# Patient Record
Sex: Male | Born: 1995
Health system: Southern US, Community
[De-identification: ages and names within clinical notes are randomized; demographics above are authoritative.]

---
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
## 2016-01-15 IMAGING — US US SCROTUM
1 series · 14 of 25 positions shown · non-contrast
Comparison: 07/13/2011

CLINICAL DATA: Left varices

EXAM:
ULTRASOUND OF SCROTUM
TECHNIQUE: Complete ultrasound examination of the testicles, epididymis, and
other scrotal structures was performed.

[Series 1: us scrotum · 0.05mm/px · 14 of 58 slices shown]
[im 1/58]
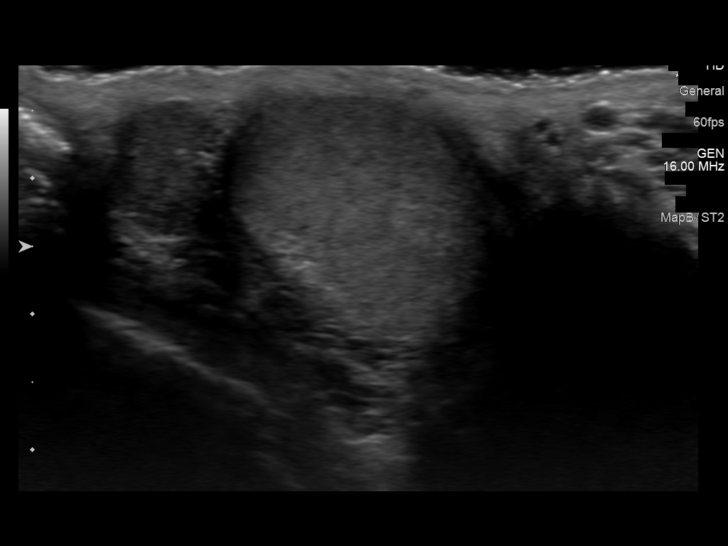
[im 5/58]
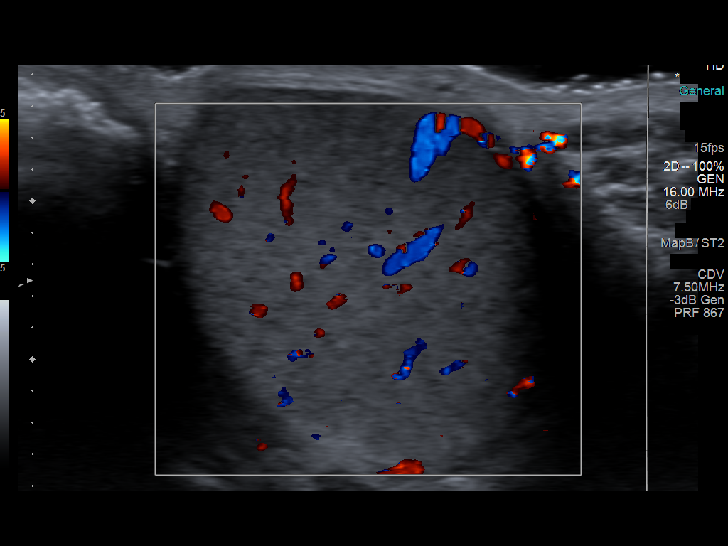
[im 10/58]
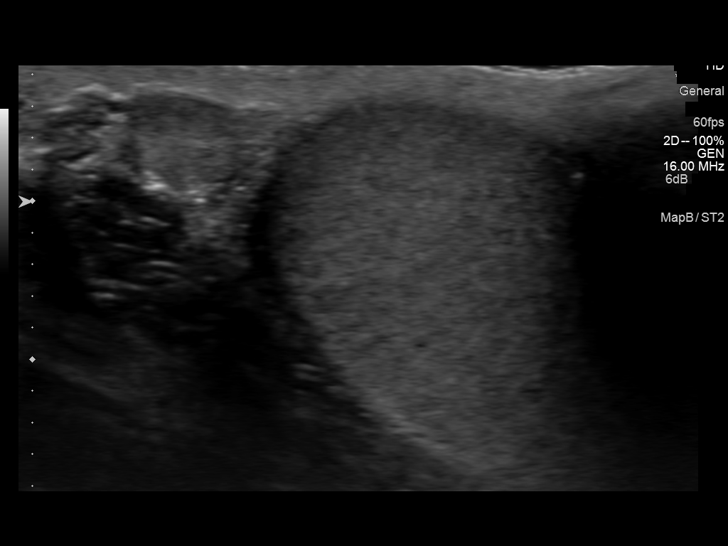
[im 15/58]
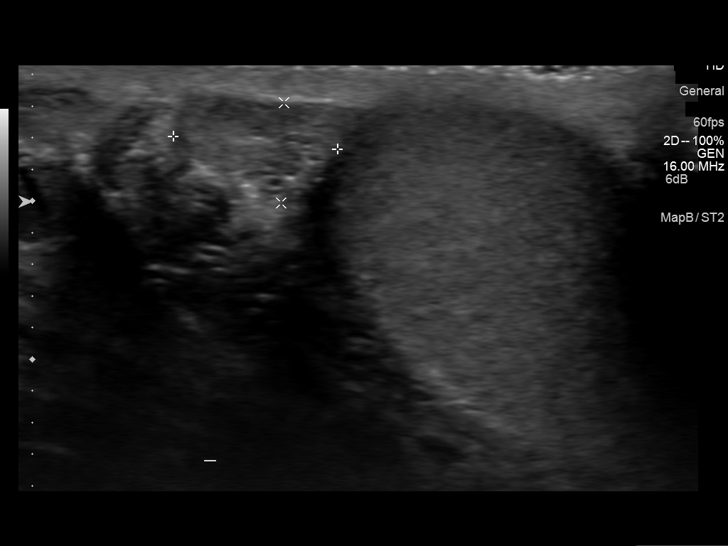
[im 20/58]
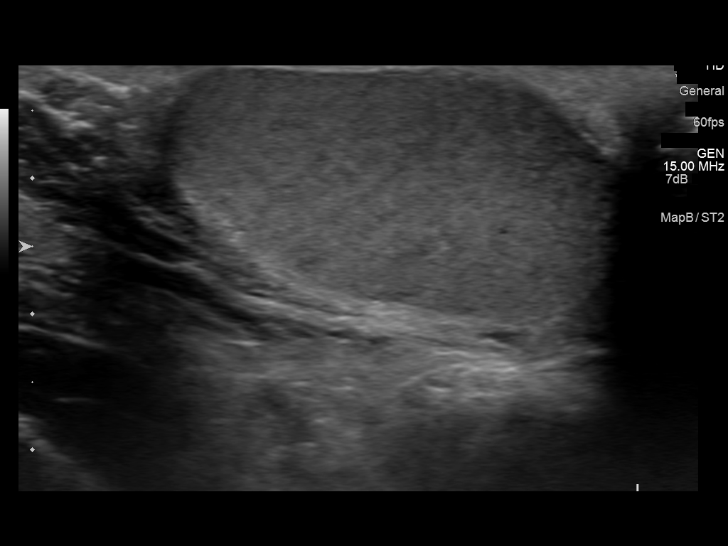
[im 22/58]
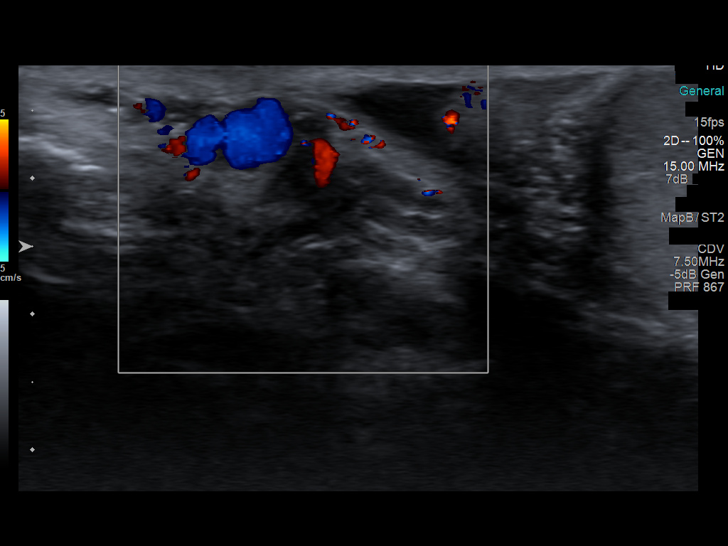
[im 27/58]
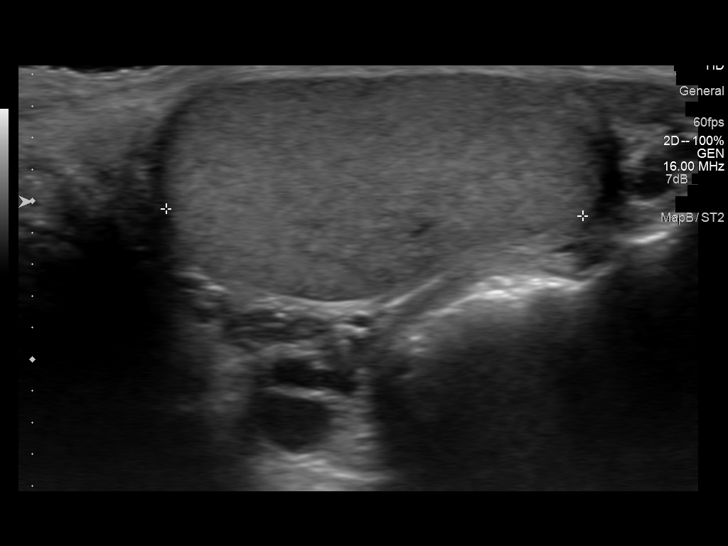
[im 31/58]
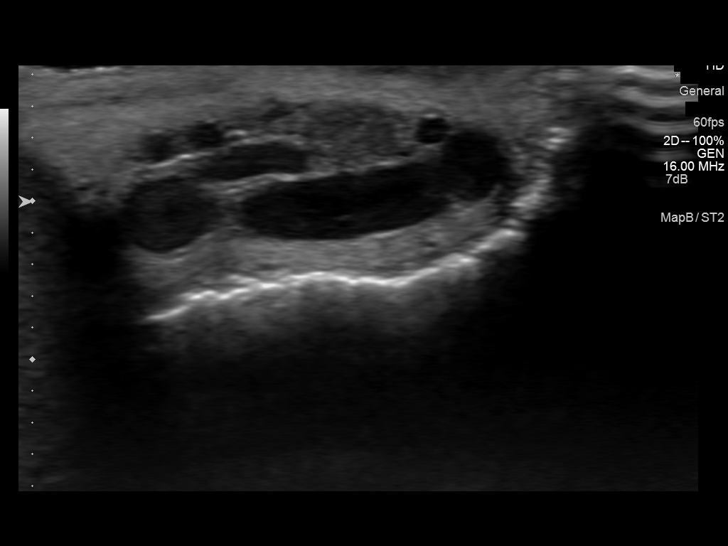
[im 36/58]
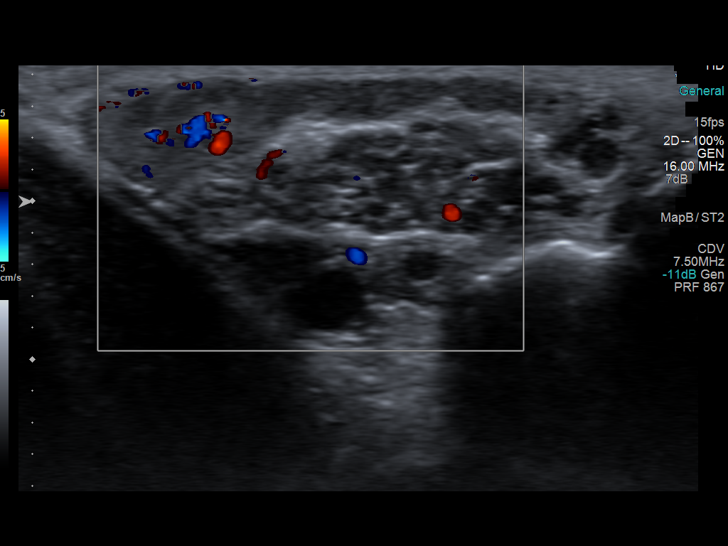
[im 39/58]
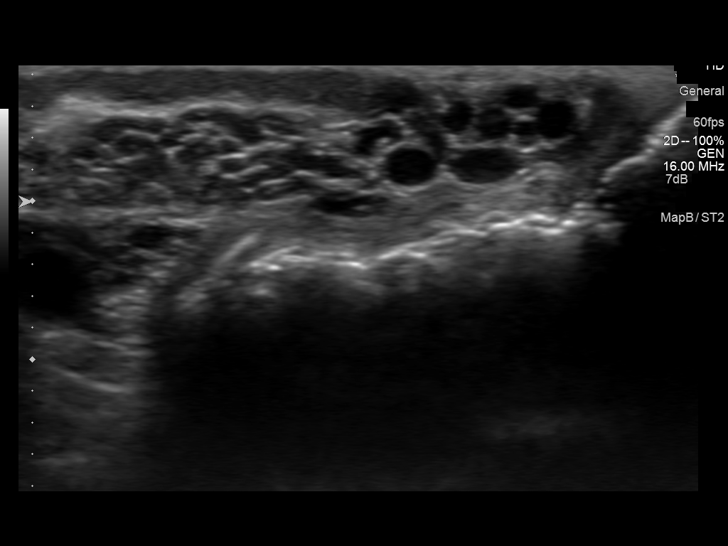
[im 43/58]
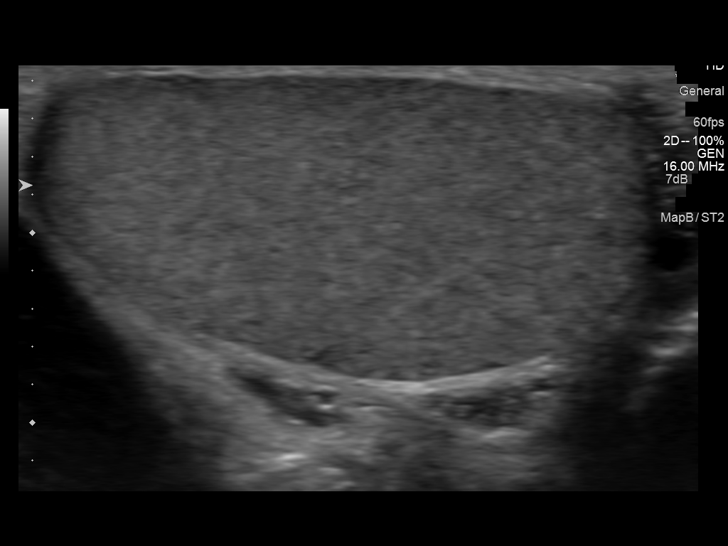
[im 48/58]
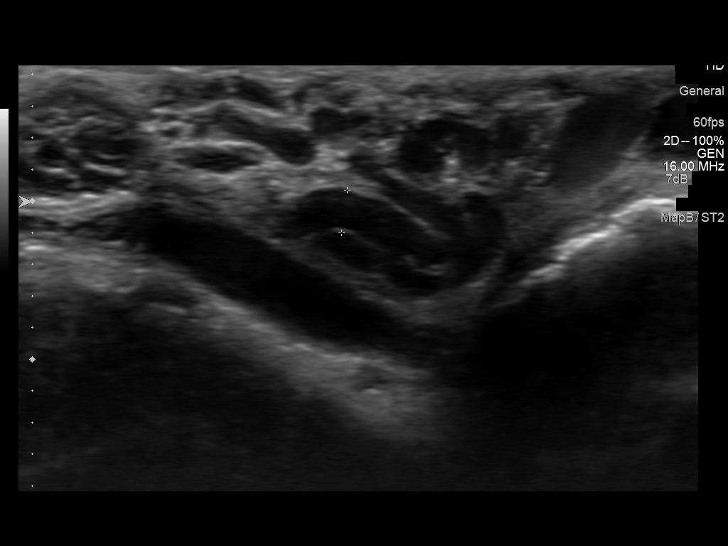
[im 53/58]
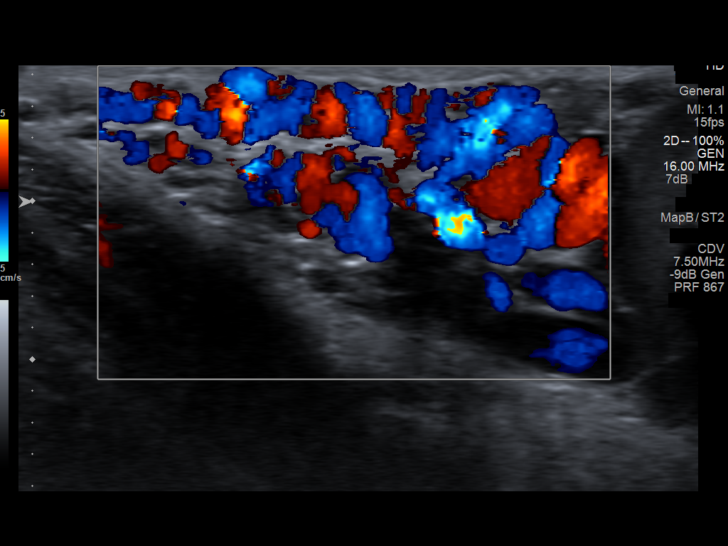
[im 58/58]
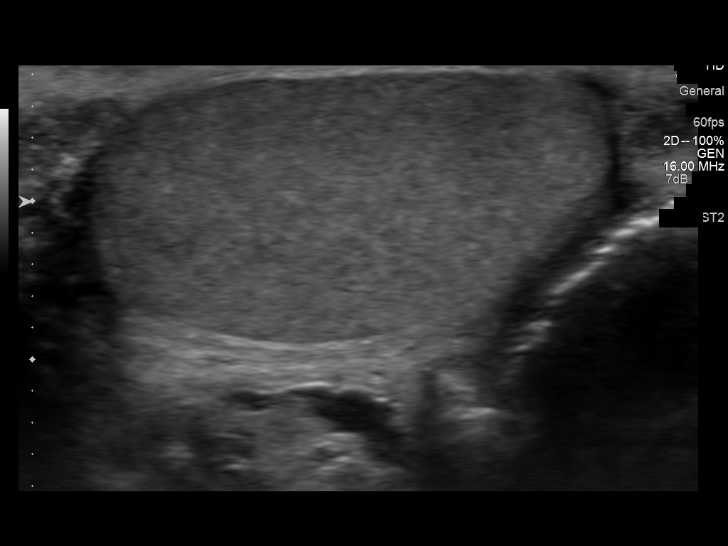

[14 of 25 positions shown; findings below may reference images not displayed]

FINDINGS: Right testicle

Measurements: 3.5 x 1.9 x 2.1 cm. No mass or microlithiasis
visualized.

Left testicle

Measurements: 3.3 x 1.6 x 2.6 cm. No mass or microlithiasis
visualized.

Right epididymis:  Normal in size and appearance.

Left epididymis:  Normal in size and appearance.

Hydrocele:  None visualized.

Varicocele:  Left varicocele.
IMPRESSION: Left varicocele.

Otherwise negative scrotal ultrasound.

## 2016-08-05 IMAGING — US US SCROTUM
1 series · 14 of 25 positions shown · non-contrast
Comparison: 02/26/2014 and earlier.

CLINICAL DATA: 18-year-old male with left varicocele status post
corrective surgery. Subsequent encounter.

EXAM:
ULTRASOUND OF SCROTUM
TECHNIQUE: Complete ultrasound examination of the testicles, epididymis, and
other scrotal structures was performed.

[Series 1: us scrotum · 0.04mm/px · 14 of 38 slices shown]
[im 1/38]
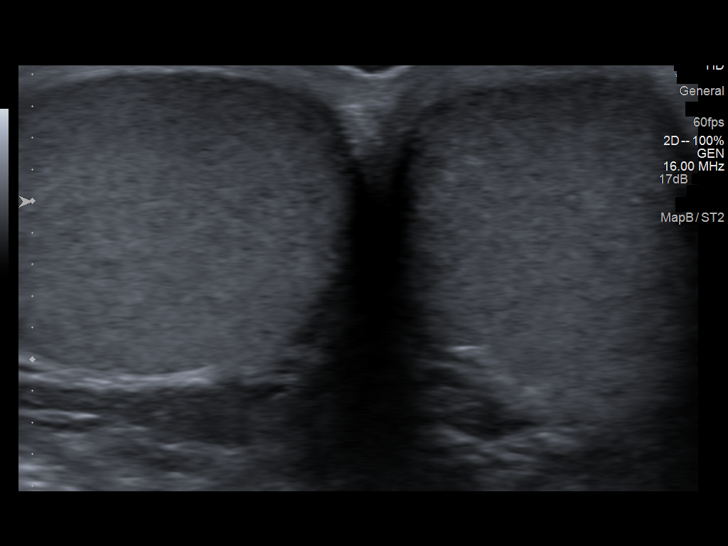
[im 4/38]
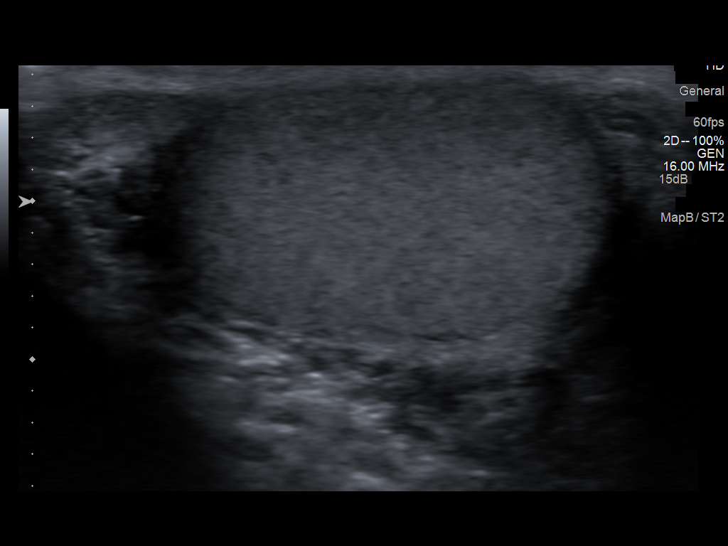
[im 7/38]
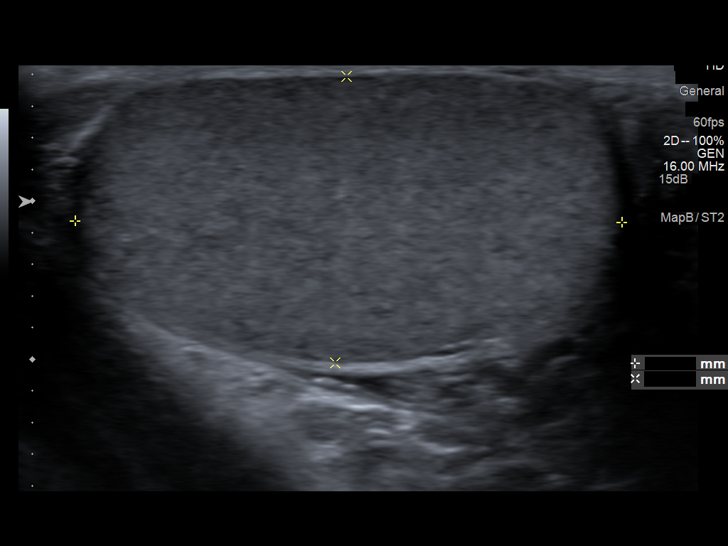
[im 10/38]
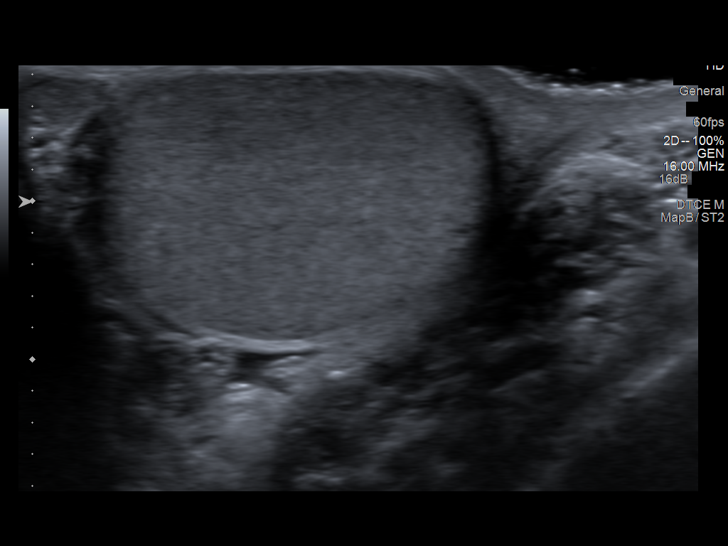
[im 13/38]
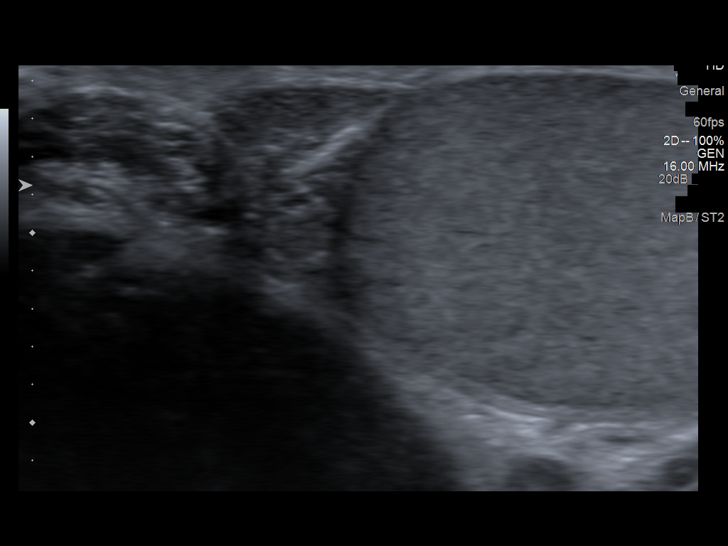
[im 14/38]
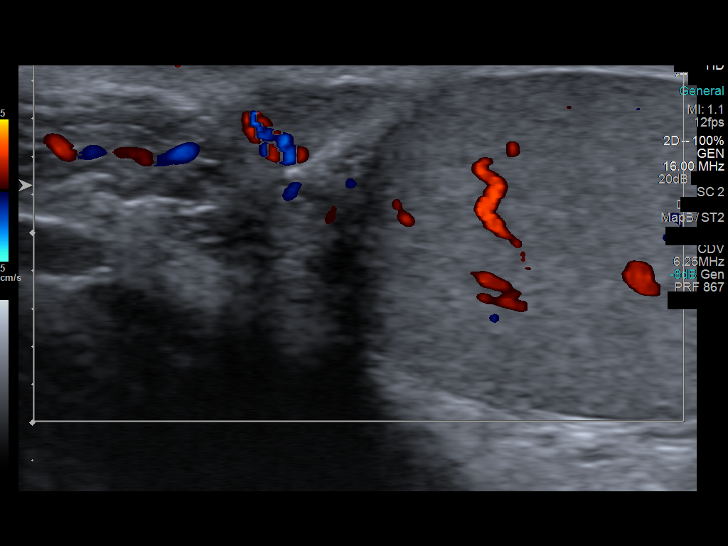
[im 17/38]
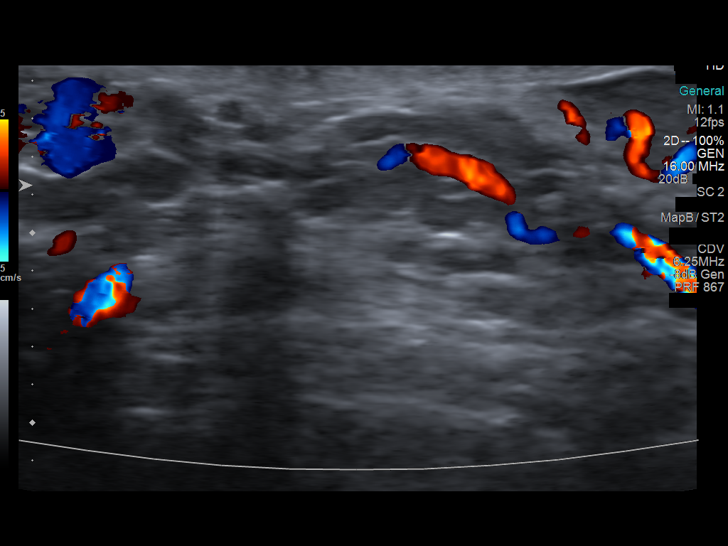
[im 21/38]
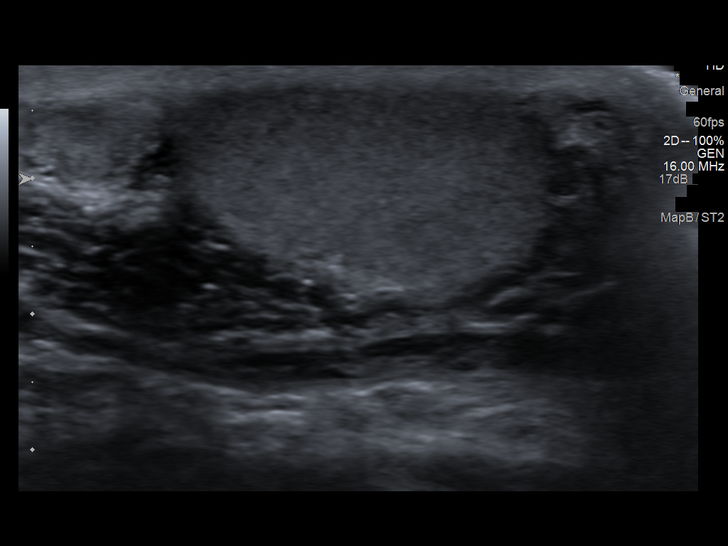
[im 24/38]
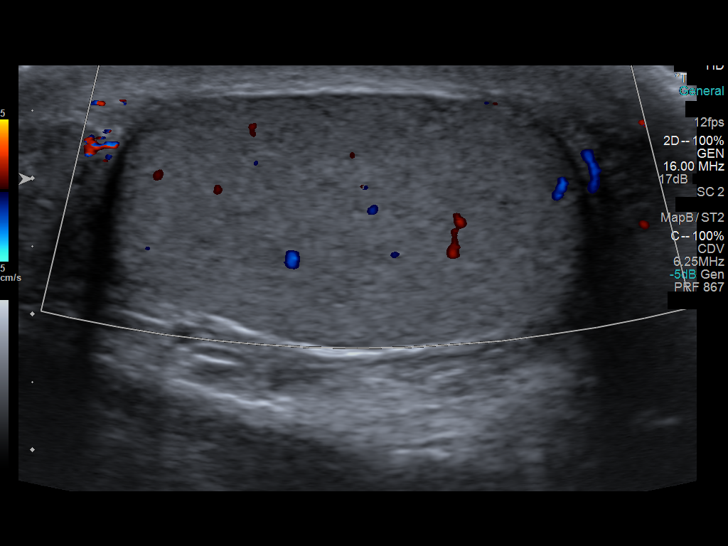
[im 25/38]
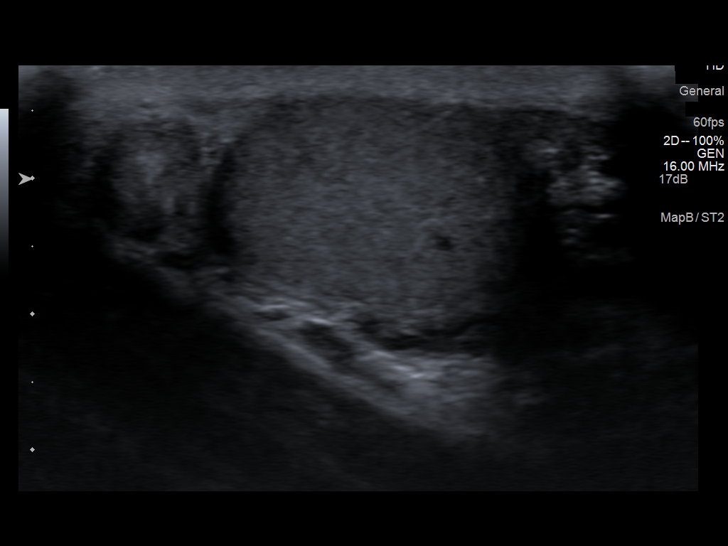
[im 28/38]
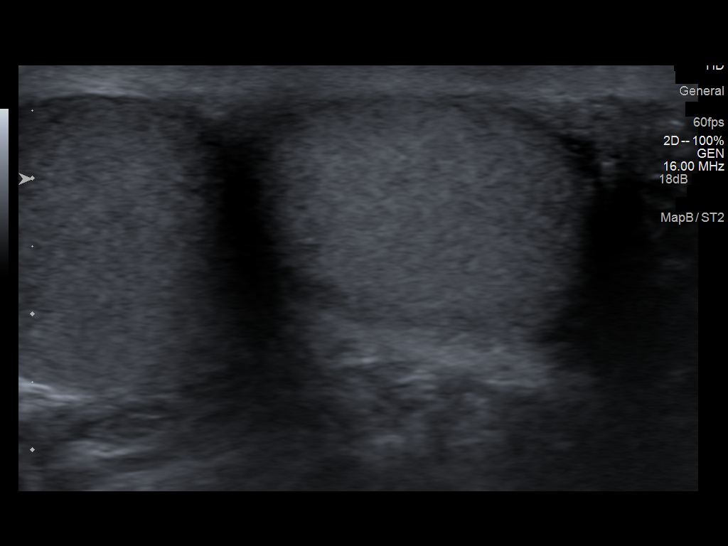
[im 31/38]
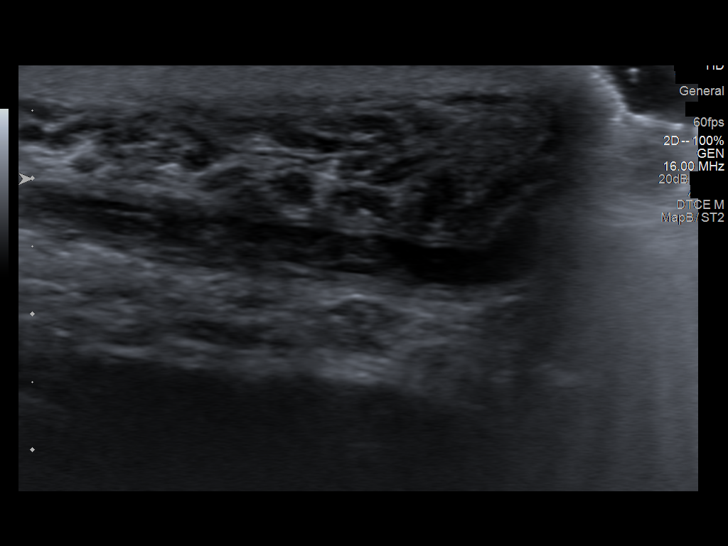
[im 34/38]
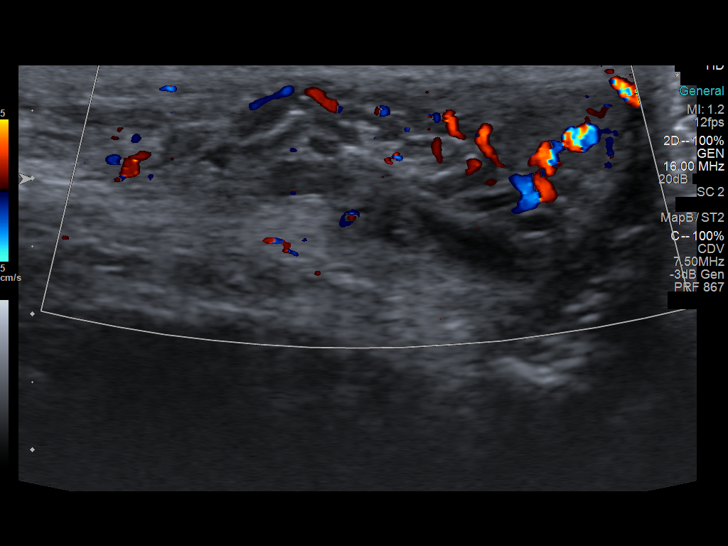
[im 38/38]
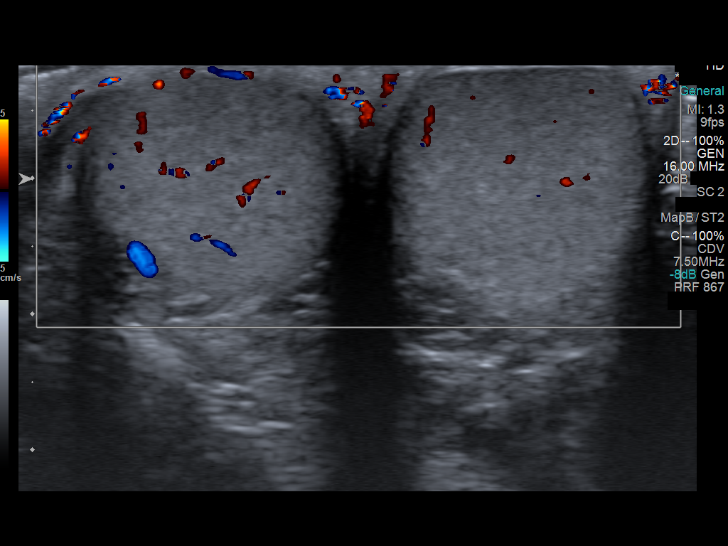

[14 of 25 positions shown; findings below may reference images not displayed]

FINDINGS: Right testicle

Measurements: 3.5 x 1.8 x 2.4 cm. No mass or microlithiasis
visualized.

Left testicle

Measurements: 3.5 x 1.8 x 2.9 cm. No mass or microlithiasis
visualized.

Right epididymis:  Normal in size and appearance.

Left epididymis:  Normal in size and appearance.

Hydrocele:  None visualized.

Varicocele: None visualized, left pampiniform venous plexus within
normal limits (compared to a image 61 on the prior exam).
IMPRESSION: Left varicocele no longer evident.  Negative scrotal ultrasound.

## 2017-04-29 DIAGNOSIS — E559 Vitamin D deficiency, unspecified: Secondary | ICD-10-CM | POA: Diagnosis not present

## 2017-04-29 DIAGNOSIS — R5383 Other fatigue: Secondary | ICD-10-CM | POA: Diagnosis not present

## 2017-06-12 ENCOUNTER — Ambulatory Visit (HOSPITAL_COMMUNITY)
Admission: EM | Admit: 2017-06-12 | Discharge: 2017-06-12 | Disposition: A | Discharge status: Home / Self Care | Payer: 59 | Attending: Family Medicine | Admitting: Family Medicine

## 2017-06-12 ENCOUNTER — Other Ambulatory Visit: Payer: Self-pay

## 2017-06-12 DIAGNOSIS — J111 Influenza due to unidentified influenza virus with other respiratory manifestations: Secondary | ICD-10-CM

## 2017-06-12 DIAGNOSIS — R69 Illness, unspecified: Secondary | ICD-10-CM | POA: Diagnosis not present

## 2017-06-12 MED ORDER — OSELTAMIVIR PHOSPHATE 75 MG PO CAPS: 75.0000 mg | ORAL_CAPSULE | Freq: Two times a day (BID) | ORAL | 0 refills | Status: AC

## 2017-06-12 MED ORDER — HYDROCODONE-HOMATROPINE 5-1.5 MG/5ML PO SYRP: 5.0000 mL | ORAL_SOLUTION | Freq: Four times a day (QID) | ORAL | 0 refills | Status: DC | PRN

## 2017-06-12 NOTE — Discharge Instructions (Signed)

## 2017-06-12 NOTE — ED Provider Notes (Signed)
  [facility]   [id number] 06/12/17 Arrival Time: 1228  ASSESSMENT & PLAN:  1. Influenza-like illness     Meds ordered this encounter  Medications  . HYDROcodone-homatropine (HYCODAN) 5-1.5 MG/5ML syrup    Sig: Take 5 mLs by mouth every 6 (six) hours as needed for cough.    Dispense:  90 mL    Refill:  0  . oseltamivir (TAMIFLU) 75 MG capsule    Sig: Take 1 capsule (75 mg total) by mouth 2 (two) times daily for 5 days.    Dispense:  10 capsule    Refill:  0   Work note given. Cough medication sedation precautions. Discussed typical duration of symptoms. OTC symptom care as needed. Ensure adequate fluid intake and rest. May f/u with PCP or here as needed.  Reviewed expectations re: course of current medical issues. Questions answered. Outlined signs and symptoms indicating need for more acute intervention. Patient verbalized understanding. After Visit Summary given.   SUBJECTIVE: History from: patient.  Jeffrey Horton is a 22 y.o. male who presents with complaint of nasal congestion, post-nasal drainage, and a persistent dry cough. Onset abrupt, approximately 1 day ago. Overall fatigued with body aches. SOB: none. Wheezing: none. Fever: yes, subjective. Overall normal PO intake without n/v. Sick contacts: no. OTC treatment: none.  Received flu shot this year: no.  Social History   Tobacco Use  Smoking Status Never Smoker    ROS: As per HPI.   OBJECTIVE:  Vitals:   06/12/17 1407  BP: 127/77  Pulse: 77  Resp: 18  Temp: 99.6 F (37.6 C)  SpO2: 100%     General appearance: alert; appears fatigued HEENT: nasal congestion; clear runny nose; throat irritation secondary to post-nasal drainage Neck: supple without LAD Lungs: unlabored respirations, symmetrical air entry; cough: moderate; no respiratory distress Skin: warm and dry Psychological: alert and cooperative; normal mood and affect   Allergies  Allergen Reactions  . Peanut-Containing  Drug Products Anaphylaxis   Social History   Socioeconomic History  . Marital status: Single    Spouse name: Not on file  . Number of children: Not on file  . Years of education: Not on file  . Highest education level: Not on file  Social Needs  . Financial resource strain: Not on file  . Food insecurity - worry: Not on file  . Food insecurity - inability: Not on file  . Transportation needs - medical: Not on file  . Transportation needs - non-medical: Not on file  Occupational History  . Not on file  Tobacco Use  . Smoking status: Never Smoker  Substance and Sexual Activity  . Alcohol use: No  . Drug use: No  . Sexual activity: Not on file  Other Topics Concern  . Not on file  Social History Narrative  . Not on file           Mardella LaymanHagler, Makeya Hilgert, MD 06/12/17 [phone]

## 2017-06-12 NOTE — ED Triage Notes (Signed)
Pt states he woke up last night feeling woozy, has been vomiting. Hot and cold chills, stomach cramps, headaches.

## 2018-04-07 ENCOUNTER — Ambulatory Visit (HOSPITAL_COMMUNITY)
Admission: EM | Admit: 2018-04-07 | Discharge: 2018-04-07 | Disposition: A | Discharge status: Home / Self Care | Payer: 59 | Attending: Family Medicine | Admitting: Family Medicine

## 2018-04-07 ENCOUNTER — Encounter (HOSPITAL_COMMUNITY): Payer: Self-pay | Admitting: Emergency Medicine

## 2018-04-07 ENCOUNTER — Other Ambulatory Visit: Payer: Self-pay

## 2018-04-07 DIAGNOSIS — K529 Noninfective gastroenteritis and colitis, unspecified: Secondary | ICD-10-CM

## 2018-04-07 MED ORDER — ONDANSETRON 4 MG PO TBDP: 4.0000 mg | ORAL_TABLET | Freq: Four times a day (QID) | ORAL | 0 refills | Status: AC | PRN

## 2018-04-07 MED ORDER — ONDANSETRON 4 MG PO TBDP: 4.0000 mg | ORAL_TABLET | Freq: Four times a day (QID) | ORAL | 0 refills | Status: DC | PRN

## 2018-04-07 MED ORDER — ONDANSETRON 4 MG PO TBDP
ORAL_TABLET | ORAL | Status: AC
  Filled 2018-04-07: qty 1

## 2018-04-07 MED ORDER — ONDANSETRON 4 MG PO TBDP
4.0000 mg | ORAL_TABLET | Freq: Once | ORAL | Status: AC
  Administered 2018-04-07: 4 mg via ORAL

## 2018-04-07 NOTE — ED Triage Notes (Signed)
Pt reports waking up vomiting around midnight last night.  He states that his g/f, brother and niece have all been vomiting since yesterday.

## 2018-04-07 NOTE — Discharge Instructions (Addendum)
Drink plenty of fluids Take the zofran as needed nausea or vomiting Add bland foods as soon as you are able Return if needed

## 2018-04-07 NOTE — ED Provider Notes (Signed)
[facility]    CSN: [id number] Arrival date & time: 04/07/18  [id number]     History   Chief Complaint Chief Complaint  Patient presents with  . Emesis    HPI Jeffrey Horton is a 22 y.o. male.   HPI  States that his girlfriend, family members, and himself all have vomiting and diarrhea.  He has started yesterday.  He is throwing up even water today.  Some loose stool.  No blood in emesis or stool.  No recent travel.  No recent antibiotics.  No severe abdominal pain.  Low-grade temperature.  He feels very tired.  He feels dizzy if he stands up quickly.  He is generally in good health.  History reviewed. No pertinent past medical history.  There are no active problems to display for this patient.   History reviewed. No pertinent surgical history.     Home Medications    Prior to Admission medications   Medication Sig Start Date End Date Taking? Authorizing Provider  cholecalciferol (VITAMIN D) 1000 UNITS tablet Take 1,000 Units by mouth daily.    [provider]  EPINEPHrine (EPIPEN 2-PAK) 0.3 mg/0.3 mL IJ SOAJ injection Inject 0.3 mLs (0.3 mg total) into the muscle once. 02/11/15   Brewington, Tishira R, PA-C  Multiple Vitamin (MULTIVITAMIN WITH MINERALS) TABS tablet Take 1 tablet by mouth daily.    [provider]  ondansetron (ZOFRAN-ODT) 4 MG disintegrating tablet Take 1-2 tablets (4-8 mg total) by mouth every 6 (six) hours as needed for nausea or vomiting. 04/07/18   Eustace MooreNelson,  Sue, MD  ranitidine (ZANTAC) 150 MG capsule Take 1 capsule (150 mg total) by mouth daily before breakfast. 05/16/14   Lowanda FosterBrewer, Mindy, NP    Family History Family History  Problem Relation Age of Onset  . Diabetes Mother     Social History Social History   Tobacco Use  . Smoking status: Current Some Day Smoker    Types: Cigars  . Smokeless tobacco: Never Used  Substance Use Topics  . Alcohol use: No  . Drug use: No     Allergies   Peanut-containing drug  products   Review of Systems Review of Systems  Constitutional: Positive for fever. Negative for chills.  HENT: Negative for ear pain and sore throat.   Eyes: Negative for pain and visual disturbance.  Respiratory: Negative for cough and shortness of breath.   Cardiovascular: Negative for chest pain and palpitations.  Gastrointestinal: Positive for abdominal pain, diarrhea, nausea and vomiting.  Genitourinary: Negative for dysuria and hematuria.  Musculoskeletal: Negative for arthralgias and back pain.  Skin: Negative for color change and rash.  Neurological: Positive for light-headedness. Negative for dizziness, seizures and syncope.  All other systems reviewed and are negative.    Physical Exam Triage Vital Signs ED Triage Vitals  Enc Vitals Group     BP 04/07/18 0950 122/78     Pulse Rate 04/07/18 0950 62     Resp 04/07/18 0950 16     Temp 04/07/18 0950 98.1 F (36.7 C)     Temp Source 04/07/18 0950 Oral     SpO2 04/07/18 0950 100 %     Weight --      Height --      Head Circumference --      Peak Flow --      Pain Score 04/07/18 0954 7     Pain Loc --      Pain Edu? --  Excl. in GC? --    No data found.  Updated Vital Signs BP 122/78 (BP Location: Left Arm)   Pulse 62   Temp 98.1 F (36.7 C) (Oral)   Resp 16   SpO2 100%   Visual Acuity Right Eye Distance:   Left Eye Distance:   Bilateral Distance:    Right Eye Near:   Left Eye Near:    Bilateral Near:     Physical Exam  Constitutional: He appears well-developed and well-nourished. No distress.  Appears ill  HENT:  Head: Normocephalic and atraumatic.  Mouth/Throat: Oropharynx is clear and moist.  His membranes are moist  Eyes: Pupils are equal, round, and reactive to light. Conjunctivae are normal.  Neck: Normal range of motion.  Cardiovascular: Normal rate and regular rhythm.  Pulmonary/Chest: Effort normal and breath sounds normal. No respiratory distress.  Abdominal: Soft. Bowel sounds  are normal. He exhibits no distension.  Active bowel sounds.  Mild generalized tenderness.  No organomegaly  Musculoskeletal: Normal range of motion. He exhibits no edema.  Neurological: He is alert.  Skin: Skin is warm and dry.  Psychiatric: He has a normal mood and affect. His behavior is normal.     UC Treatments / Results  Labs (all labs ordered are listed, but only abnormal results are displayed) Labs Reviewed - No data to display  EKG None  Radiology No results found.  Procedures Procedures (including critical care time)  Medications Ordered in UC Medications  ondansetron (ZOFRAN-ODT) disintegrating tablet 4 mg (4 mg Oral Given 04/07/18 0959)    Initial Impression / Assessment and Plan / UC Course  I have reviewed the triage vital signs and the nursing notes.  Pertinent labs & imaging results that were available during my care of the patient were reviewed by me and considered in my medical decision making (see chart for details).     Some improvement from Zofran.  Sent home with prescriptions and instructions.  Return as needed Final Clinical Impressions(s) / UC Diagnoses   Final diagnoses:  Gastroenteritis     Discharge Instructions     Drink plenty of fluids Take the zofran as needed nausea or vomiting Add bland foods as soon as you are able Return if needed     ED Prescriptions    Medication Sig Dispense Auth. Provider   ondansetron (ZOFRAN-ODT) 4 MG disintegrating tablet  (Status: Discontinued) Take 1-2 tablets (4-8 mg total) by mouth every 6 (six) hours as needed for nausea or vomiting. 20 tablet Eustace Moore, MD   ondansetron (ZOFRAN-ODT) 4 MG disintegrating tablet Take 1-2 tablets (4-8 mg total) by mouth every 6 (six) hours as needed for nausea or vomiting. 20 tablet Eustace Moore, MD     Controlled Substance Prescriptions [place] Controlled Substance Registry consulted? Not Applicable   Eustace Moore, MD 04/07/18 1120

## 2019-09-04 ENCOUNTER — Encounter (HOSPITAL_COMMUNITY): Payer: Self-pay | Admitting: *Deleted

## 2019-09-04 ENCOUNTER — Emergency Department (HOSPITAL_COMMUNITY)
Admission: EM | Admit: 2019-09-04 | Discharge: 2019-09-04 | Disposition: A | Discharge status: Home / Self Care | Payer: 59 | Attending: Emergency Medicine | Admitting: Emergency Medicine

## 2019-09-04 ENCOUNTER — Emergency Department (HOSPITAL_COMMUNITY): Payer: 59

## 2019-09-04 DIAGNOSIS — F17290 Nicotine dependence, other tobacco product, uncomplicated: Secondary | ICD-10-CM | POA: Diagnosis not present

## 2019-09-04 DIAGNOSIS — R17 Unspecified jaundice: Secondary | ICD-10-CM

## 2019-09-04 DIAGNOSIS — N3000 Acute cystitis without hematuria: Secondary | ICD-10-CM | POA: Diagnosis not present

## 2019-09-04 DIAGNOSIS — R799 Abnormal finding of blood chemistry, unspecified: Secondary | ICD-10-CM | POA: Insufficient documentation

## 2019-09-04 DIAGNOSIS — R112 Nausea with vomiting, unspecified: Secondary | ICD-10-CM | POA: Diagnosis present

## 2019-09-04 LAB — COMPREHENSIVE METABOLIC PANEL
ALT: 14 U/L (ref 0–44)
AST: 24 U/L (ref 15–41)
Albumin: 4.6 g/dL (ref 3.5–5.0)
Alkaline Phosphatase: 68 U/L (ref 38–126)
Anion gap: 11 (ref 5–15)
BUN: 13 mg/dL (ref 6–20)
CO2: 24 mmol/L (ref 22–32)
Calcium: 9.7 mg/dL (ref 8.9–10.3)
Chloride: 105 mmol/L (ref 98–111)
Creatinine, Ser: 1.02 mg/dL (ref 0.61–1.24)
GFR calc Af Amer: >60 mL/min (ref >60)
GFR calc non Af Amer: >60 mL/min (ref >60)
Glucose, Bld: 104 mg/dL — ABNORMAL HIGH (ref 70–99)
Potassium: 4 mmol/L (ref 3.5–5.1)
Sodium: 140 mmol/L (ref 135–145)
Total Bilirubin: 1.6 mg/dL — ABNORMAL HIGH (ref 0.3–1.2)
Total Protein: 8.2 g/dL — ABNORMAL HIGH (ref 6.5–8.1)

## 2019-09-04 LAB — URINE CULTURE

## 2019-09-04 LAB — URINALYSIS, ROUTINE W REFLEX MICROSCOPIC
Bilirubin Urine: NEGATIVE
Glucose, UA: NEGATIVE mg/dL
Hgb urine dipstick: NEGATIVE
Ketones, ur: 5 mg/dL — AB
Nitrite: NEGATIVE
Protein, ur: NEGATIVE mg/dL
Specific Gravity, Urine: 1.021 (ref 1.005–1.030)
pH: 5 (ref 5.0–8.0)

## 2019-09-04 LAB — CBC
HCT: 46.3 % (ref 39.0–52.0)
Hemoglobin: 15.9 g/dL (ref 13.0–17.0)
MCH: 32 pg (ref 26.0–34.0)
MCHC: 34.3 g/dL (ref 30.0–36.0)
MCV: 93.2 fL (ref 80.0–100.0)
Platelets: 195 10*3/uL (ref 150–400)
RBC: 4.97 MIL/uL (ref 4.22–5.81)
RDW: 11.5 % (ref 11.5–15.5)
WBC: 10 10*3/uL (ref 4.0–10.5)
nRBC: 0 % (ref 0.0–0.2)

## 2019-09-04 LAB — LIPASE, BLOOD: Lipase: 25 U/L (ref 11–51)

## 2019-09-04 MED ORDER — CEPHALEXIN 500 MG PO CAPS: 500.0000 mg | ORAL_CAPSULE | Freq: Four times a day (QID) | ORAL | 0 refills | Status: AC

## 2019-09-04 MED ORDER — SODIUM CHLORIDE 0.9% FLUSH: 3.0000 mL | Freq: Once | INTRAVENOUS | Status: DC

## 2019-09-04 MED ORDER — CEFTRIAXONE SODIUM 1 G IJ SOLR
1.0000 g | Freq: Once | INTRAMUSCULAR | Status: AC
  Administered 2019-09-04: 04:00:00 1 g via INTRAMUSCULAR
  Filled 2019-09-04: qty 10

## 2019-09-04 MED ORDER — ONDANSETRON 8 MG PO TBDP: ORAL_TABLET | ORAL | 0 refills | Status: AC

## 2019-09-04 MED ORDER — ONDANSETRON 4 MG PO TBDP
8.0000 mg | ORAL_TABLET | Freq: Once | ORAL | Status: AC
  Administered 2019-09-04: 04:00:00 8 mg via ORAL
  Filled 2019-09-04: qty 2

## 2019-09-04 MED ORDER — LIDOCAINE HCL (PF) 1 % IJ SOLN
INTRAMUSCULAR | Status: AC
  Filled 2019-09-04: qty 5

## 2019-09-04 NOTE — ED Triage Notes (Signed)
To ED for eval of vomiting x8 today. No abd pain. No difficulty urinating. Last ate [employer] at 1pm yesterday. Started vomiting around 9pm. Appears in nad

## 2019-09-04 NOTE — ED Notes (Signed)
Pt discharged from ED; instructions provided and scripts given; Pt encouraged to return to ED if symptoms worsen and to f/u with PCP; Pt verbalized understanding of all instructions 

## 2019-09-04 NOTE — ED Provider Notes (Signed)
[facility] EMERGENCY DEPARTMENT Provider Note   CSN: [id number] Arrival date & time: 09/04/19  0113     History Chief Complaint  Patient presents with  . Emesis    Jeffrey Horton is a 24 y.o. male.  The history is provided by the patient.  Emesis Severity:  Moderate Duration:  6 hours Timing:  Intermittent Quality:  Stomach contents Progression:  Unchanged Chronicity:  New Recent urination:  Normal Context: not post-tussive   Relieved by:  Nothing Worsened by:  Nothing Ineffective treatments:  None tried Associated symptoms: no abdominal pain, no arthralgias, no cough, no diarrhea, no fever and no URI   Risk factors: sick contacts   Risk factors comment:  Cousin in the home with gastroenteritis  Patient with emesis since 9 pm.  No diarrhea.  No abdominal pain.  No f/c/r.  No urinary symptoms.       History reviewed. No pertinent past medical history.  There are no problems to display for this patient.   History reviewed. No pertinent surgical history.     Family History  Problem Relation Age of Onset  . Diabetes Mother     Social History   Tobacco Use  . Smoking status: Current Some Day Smoker    Types: Cigars  . Smokeless tobacco: Never Used  Substance Use Topics  . Alcohol use: No  . Drug use: No    Home Medications Prior to Admission medications   Medication Sig Start Date End Date Taking? Authorizing Provider  cholecalciferol (VITAMIN D) 1000 UNITS tablet Take 1,000 Units by mouth daily.    [provider]  EPINEPHrine (EPIPEN 2-PAK) 0.3 mg/0.3 mL IJ SOAJ injection Inject 0.3 mLs (0.3 mg total) into the muscle once. 02/11/15   Brewington, Tishira R, PA-C  Multiple Vitamin (MULTIVITAMIN WITH MINERALS) TABS tablet Take 1 tablet by mouth daily.    [provider]  ondansetron (ZOFRAN-ODT) 4 MG disintegrating tablet Take 1-2 tablets (4-8 mg total) by mouth every 6 (six) hours as needed for nausea or vomiting.  04/07/18   Eustace Moore, MD  ranitidine (ZANTAC) 150 MG capsule Take 1 capsule (150 mg total) by mouth daily before breakfast. 05/16/14   Lowanda Foster, NP    Allergies    Peanut-containing drug products  Review of Systems   Review of Systems  Constitutional: Negative for fever.  HENT: Negative for congestion.   Eyes: Negative for visual disturbance.  Respiratory: Negative for cough.   Gastrointestinal: Positive for nausea and vomiting. Negative for abdominal pain and diarrhea.  Genitourinary: Negative for dysuria.  Musculoskeletal: Negative for arthralgias.  Skin: Negative for rash.  Neurological: Negative for dizziness.  Psychiatric/Behavioral: Negative for agitation.  All other systems reviewed and are negative.   Physical Exam Updated Vital Signs BP 121/78 (BP Location: Right Arm)   Pulse 70   Temp 97.7 F (36.5 C) (Oral)   Resp 20   SpO2 100%   Physical Exam Vitals and nursing note reviewed.  Constitutional:      General: He is not in acute distress.    Appearance: Normal appearance.  HENT:     Head: Normocephalic and atraumatic.     Nose: Nose normal.  Eyes:     Conjunctiva/sclera: Conjunctivae normal.     Pupils: Pupils are equal, round, and reactive to light.  Cardiovascular:     Rate and Rhythm: Normal rate and regular rhythm.     Pulses: Normal pulses.     Heart sounds: Normal  heart sounds.  Pulmonary:     Effort: Pulmonary effort is normal.     Breath sounds: Normal breath sounds.  Abdominal:     General: Abdomen is flat. Bowel sounds are normal.     Tenderness: There is no abdominal tenderness. There is no guarding or rebound.  Musculoskeletal:        General: Normal range of motion.     Cervical back: Normal range of motion and neck supple.  Skin:    General: Skin is warm and dry.     Capillary Refill: Capillary refill takes less than 2 seconds.  Neurological:     General: No focal deficit present.     Mental Status: He is alert and  oriented to person, place, and time.     Deep Tendon Reflexes: Reflexes normal.  Psychiatric:        Mood and Affect: Mood normal.        Behavior: Behavior normal.     ED Results / Procedures / Treatments   Labs (all labs ordered are listed, but only abnormal results are displayed) Results for orders placed or performed during the hospital encounter of 09/04/19  Lipase, blood  Result Value Ref Range   Lipase 25 11 - 51 U/L  Comprehensive metabolic panel  Result Value Ref Range   Sodium 140 135 - 145 mmol/L   Potassium 4.0 3.5 - 5.1 mmol/L   Chloride 105 98 - 111 mmol/L   CO2 24 22 - 32 mmol/L   Glucose, Bld 104 (H) 70 - 99 mg/dL   BUN 13 6 - 20 mg/dL   Creatinine, Ser 1.66 0.61 - 1.24 mg/dL   Calcium 9.7 8.9 - 06.3 mg/dL   Total Protein 8.2 (H) 6.5 - 8.1 g/dL   Albumin 4.6 3.5 - 5.0 g/dL   AST 24 15 - 41 U/L   ALT 14 0 - 44 U/L   Alkaline Phosphatase 68 38 - 126 U/L   Total Bilirubin 1.6 (H) 0.3 - 1.2 mg/dL   GFR calc non Af Amer >60 >60 mL/min   GFR calc Af Amer >60 >60 mL/min   Anion gap 11 5 - 15  CBC  Result Value Ref Range   WBC 10.0 4.0 - 10.5 K/uL   RBC 4.97 4.22 - 5.81 MIL/uL   Hemoglobin 15.9 13.0 - 17.0 g/dL   HCT 01.6 01.0 - 93.2 %   MCV 93.2 80.0 - 100.0 fL   MCH 32.0 26.0 - 34.0 pg   MCHC 34.3 30.0 - 36.0 g/dL   RDW 35.5 73.2 - 20.2 %   Platelets 195 150 - 400 K/uL   nRBC 0.0 0.0 - 0.2 %  Urinalysis, Routine w reflex microscopic  Result Value Ref Range   Color, Urine YELLOW YELLOW   APPearance HAZY (A) CLEAR   Specific Gravity, Urine 1.021 1.005 - 1.030   pH 5.0 5.0 - 8.0   Glucose, UA NEGATIVE NEGATIVE mg/dL   Hgb urine dipstick NEGATIVE NEGATIVE   Bilirubin Urine NEGATIVE NEGATIVE   Ketones, ur 5 (A) NEGATIVE mg/dL   Protein, ur NEGATIVE NEGATIVE mg/dL   Nitrite NEGATIVE NEGATIVE   Leukocytes,Ua MODERATE (A) NEGATIVE   RBC / HPF 0-5 0 - 5 RBC/hpf   WBC, UA 21-50 0 - 5 WBC/hpf   Bacteria, UA RARE (A) NONE SEEN   Squamous Epithelial / LPF  0-5 0 - 5   Mucus PRESENT    CT Renal Stone Study  Result Date: 09/04/2019 CLINICAL DATA:  Left-sided abdominal pain with vomiting EXAM: CT ABDOMEN AND PELVIS WITHOUT CONTRAST TECHNIQUE: Multidetector CT imaging of the abdomen and pelvis was performed following the standard protocol without IV contrast. COMPARISON:  None. FINDINGS: Lower chest:  No contributory findings. Hepatobiliary: No focal liver abnormality.No evidence of biliary obstruction or stone. Pancreas: Unremarkable. Spleen: Unremarkable. Adrenals/Urinary Tract: Negative adrenals. No hydronephrosis or stone. Unremarkable bladder. Stomach/Bowel: There are a few fluid-filled small bowel loops in the pelvis but no generalized obstructive change. Appendix is seen in the right lower quadrant with 2 appendicoliths. No regional inflammation. Vascular/Lymphatic: No acute vascular abnormality. No mass or adenopathy. Reproductive:No pathologic findings. Other: No ascites or pneumoperitoneum. Musculoskeletal: No acute abnormalities. 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 IMPRESSION: No acute finding.  No hydronephrosis or urolithiasis. Electronically Signed   By: Monte Fantasia M.D.   On: 09/04/2019 04:25    Radiology CT Renal Stone Study  Result Date: 09/04/2019 CLINICAL DATA:  Left-sided abdominal pain with vomiting EXAM: CT ABDOMEN AND PELVIS WITHOUT CONTRAST TECHNIQUE: Multidetector CT imaging of the abdomen and pelvis was performed following the standard protocol without IV contrast. COMPARISON:  None. FINDINGS: Lower chest:  No contributory findings. Hepatobiliary: No focal liver abnormality.No evidence of biliary obstruction or stone. Pancreas: Unremarkable. Spleen: Unremarkable. Adrenals/Urinary Tract: Negative adrenals. No hydronephrosis or stone. Unremarkable bladder. Stomach/Bowel: There are a few fluid-filled small bowel loops in the pelvis but no generalized obstructive change.  Appendix is seen in the right lower quadrant with 2 appendicoliths. No regional inflammation. Vascular/Lymphatic: No acute vascular abnormality. No mass or adenopathy. Reproductive:No pathologic findings. Other: No ascites or pneumoperitoneum. Musculoskeletal: No acute abnormalities. 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 IMPRESSION: No acute finding.  No hydronephrosis or urolithiasis. Electronically Signed   By: Monte Fantasia M.D.   On: 09/04/2019 04:25    Procedures Procedures (including critical care time)  Medications Ordered in ED Medications  sodium chloride flush (NS) 0.9 % injection 3 mL (3 mLs Intravenous Not Given 09/04/19 0351)  lidocaine (PF) (XYLOCAINE) 1 % injection (has no administration in time range)  ondansetron (ZOFRAN-ODT) disintegrating tablet 8 mg (8 mg Oral Given 09/04/19 0357)  cefTRIAXone (ROCEPHIN) injection 1 g (1 g Intramuscular Given 09/04/19 0358)    ED Course  I have reviewed the triage vital signs and the nursing notes.  Pertinent labs & imaging results that were available during my care of the patient were reviewed by me and considered in my medical decision making (see chart for details).    I have informed the patient of the elevated bilirubin.  I suspect the patient has Gilbert's but I have informed of need to follow up with his PMD for further evaluation of this test.  Patient verbalizes understanding and agrees to follow up.  No acute abnormality in the abdomen.  Patient will be treated for UTI as an outpatient. I suspect this is gastroenteritis as patient has had a close familial contact with same. PO challenged successfully in the ED.    Jeffrey Horton was evaluated in Emergency Department on 09/04/2019 for the symptoms described in the history of present illness. He was evaluated in the context of the global COVID-19 pandemic, which necessitated consideration that the patient might be at risk  for infection with the SARS-CoV-2 virus that causes COVID-19. Institutional protocols and algorithms that pertain to the evaluation of patients at risk for COVID-19 are in  a state of rapid change based on information released by regulatory bodies including the CDC and federal and state organizations. These policies and algorithms were followed during the patient's care in the ED.  Final Clinical Impression(s) / ED Diagnoses Return for weakness, numbness, changes in vision or speech, fevers >100.4 unrelieved by medication, shortness of breath, intractable vomiting, or diarrhea, abdominal pain, Inability to tolerate liquids or food, cough, altered mental status or any concerns. No signs of systemic illness or infection. The patient is nontoxic-appearing on exam and vital signs are within normal limits.   I have reviewed the triage vital signs and the nursing notes. Pertinent labs &imaging results that were available during my care of the patient were reviewed by me and considered in my medical decision making (see chart for details).  After history, exam, and medical workup I feel the patient has been appropriately medically screened and is safe for discharge home. Pertinent diagnoses were discussed with the patient. Patient was givenstrictreturn precautions.   Blaze Nylund, MD 09/04/19 [phone]

## 2020-05-17 ENCOUNTER — Other Ambulatory Visit: Payer: Self-pay

## 2020-05-17 ENCOUNTER — Emergency Department (HOSPITAL_COMMUNITY)
Admission: EM | Admit: 2020-05-17 | Discharge: 2020-05-17 | Disposition: A | Discharge status: Home / Self Care | Payer: 59 | Attending: Emergency Medicine | Admitting: Emergency Medicine

## 2020-05-17 ENCOUNTER — Encounter (HOSPITAL_COMMUNITY): Payer: Self-pay | Admitting: Emergency Medicine

## 2020-05-17 DIAGNOSIS — U071 COVID-19: Secondary | ICD-10-CM | POA: Insufficient documentation

## 2020-05-17 DIAGNOSIS — Z5321 Procedure and treatment not carried out due to patient leaving prior to being seen by health care provider: Secondary | ICD-10-CM | POA: Diagnosis not present

## 2020-05-17 DIAGNOSIS — R0602 Shortness of breath: Secondary | ICD-10-CM | POA: Diagnosis present

## 2020-05-17 LAB — URINALYSIS, ROUTINE W REFLEX MICROSCOPIC
Bacteria, UA: NONE SEEN
Bilirubin Urine: NEGATIVE
Glucose, UA: NEGATIVE mg/dL
Hgb urine dipstick: NEGATIVE
Ketones, ur: NEGATIVE mg/dL
Nitrite: NEGATIVE
Protein, ur: NEGATIVE mg/dL
Specific Gravity, Urine: 1.019 (ref 1.005–1.030)
pH: 9 — ABNORMAL HIGH (ref 5.0–8.0)

## 2020-05-17 LAB — CBC
HCT: 40.7 % (ref 39.0–52.0)
Hemoglobin: 14.2 g/dL (ref 13.0–17.0)
MCH: 32.2 pg (ref 26.0–34.0)
MCHC: 34.9 g/dL (ref 30.0–36.0)
MCV: 92.3 fL (ref 80.0–100.0)
Platelets: 161 10*3/uL (ref 150–400)
RBC: 4.41 MIL/uL (ref 4.22–5.81)
RDW: 11.6 % (ref 11.5–15.5)
WBC: 6.2 10*3/uL (ref 4.0–10.5)
nRBC: 0 % (ref 0.0–0.2)

## 2020-05-17 LAB — COMPREHENSIVE METABOLIC PANEL
ALT: 12 U/L (ref 0–44)
AST: 22 U/L (ref 15–41)
Albumin: 4.2 g/dL (ref 3.5–5.0)
Alkaline Phosphatase: 53 U/L (ref 38–126)
Anion gap: 8 (ref 5–15)
BUN: 16 mg/dL (ref 6–20)
CO2: 23 mmol/L (ref 22–32)
Calcium: 9.6 mg/dL (ref 8.9–10.3)
Chloride: 106 mmol/L (ref 98–111)
Creatinine, Ser: 1.05 mg/dL (ref 0.61–1.24)
GFR, Estimated: >60 mL/min (ref >60)
Glucose, Bld: 93 mg/dL (ref 70–99)
Potassium: 3.6 mmol/L (ref 3.5–5.1)
Sodium: 137 mmol/L (ref 135–145)
Total Bilirubin: 1.1 mg/dL (ref 0.3–1.2)
Total Protein: 7.2 g/dL (ref 6.5–8.1)

## 2020-05-17 LAB — SARS CORONAVIRUS 2 (TAT 6-24 HRS): SARS Coronavirus 2: POSITIVE — AB

## 2020-05-17 LAB — LIPASE, BLOOD: Lipase: 26 U/L (ref 11–51)

## 2020-05-17 NOTE — ED Triage Notes (Signed)
Patient reports left abdominal pain with emesis onset last night , also requesting Covid test due to mild SOB .

## 2020-05-17 NOTE — ED Notes (Signed)
Pt name called for updated vitals, no response 

## 2020-05-17 NOTE — ED Notes (Signed)
Pt name called again for updated vitals, no response  

## 2020-05-20 ENCOUNTER — Telehealth (HOSPITAL_COMMUNITY): Payer: Self-pay
